# Patient Record
Sex: Female | Born: 1955 | Race: Black or African American | Hispanic: No | Smoking: Current every day smoker
Health system: Southern US, Community
[De-identification: ages and names within clinical notes are randomized; demographics above are authoritative.]

## PROBLEM LIST (undated history)

## (undated) DIAGNOSIS — I10 Essential (primary) hypertension: Secondary | ICD-10-CM

## (undated) DIAGNOSIS — M1990 Unspecified osteoarthritis, unspecified site: Secondary | ICD-10-CM

## (undated) HISTORY — PX: NASAL SINUS SURGERY: SHX719

---

## 2009-09-29 ENCOUNTER — Emergency Department (HOSPITAL_BASED_OUTPATIENT_CLINIC_OR_DEPARTMENT_OTHER): Admission: EM | Admit: 2009-09-29 | Discharge: 2009-09-29 | Payer: Self-pay | Admitting: Emergency Medicine

## 2009-11-06 ENCOUNTER — Ambulatory Visit: Payer: Self-pay | Admitting: Family

## 2009-11-06 DIAGNOSIS — G43909 Migraine, unspecified, not intractable, without status migrainosus: Secondary | ICD-10-CM | POA: Insufficient documentation

## 2009-11-06 DIAGNOSIS — R1013 Epigastric pain: Secondary | ICD-10-CM

## 2009-11-06 DIAGNOSIS — J329 Chronic sinusitis, unspecified: Secondary | ICD-10-CM | POA: Insufficient documentation

## 2009-11-27 ENCOUNTER — Other Ambulatory Visit: Admission: RE | Admit: 2009-11-27 | Discharge: 2009-11-27 | Payer: Self-pay | Admitting: Internal Medicine

## 2009-11-27 ENCOUNTER — Ambulatory Visit: Payer: Self-pay | Admitting: Family

## 2009-11-27 DIAGNOSIS — M545 Low back pain: Secondary | ICD-10-CM

## 2009-11-27 LAB — CONVERTED CEMR LAB: Pap Smear: NEGATIVE

## 2009-11-29 ENCOUNTER — Encounter (INDEPENDENT_AMBULATORY_CARE_PROVIDER_SITE_OTHER): Payer: Self-pay | Admitting: *Deleted

## 2009-12-03 ENCOUNTER — Encounter: Payer: Self-pay | Admitting: Family

## 2010-01-07 ENCOUNTER — Emergency Department (HOSPITAL_BASED_OUTPATIENT_CLINIC_OR_DEPARTMENT_OTHER): Admission: EM | Admit: 2010-01-07 | Discharge: 2010-01-07 | Payer: Self-pay | Admitting: Emergency Medicine

## 2010-08-24 ENCOUNTER — Encounter: Payer: Self-pay | Admitting: Internal Medicine

## 2010-09-02 NOTE — Letter (Signed)
   [facility] [address] [place], [place]  [address]  [place] Phone: [phone]      Dec 03, 2009   [facility] [place] [address] [place], [place] [address]  RE:  LAB RESULTS  Dear  Ms. Pinkney,  The following is an interpretation of your most recent lab tests.  Please take note of any instructions provided or changes to medications that have resulted from your lab work.  Pap Smear: normal   Sincerely Yours,    Lemont Fillers FNP

## 2010-09-02 NOTE — Letter (Signed)
Summary: New Patient letter  [facility]  [address] [place], [place] [address]   Phone: [phone]  Fax: [phone]       11/29/2009 MRN: [id number]  [facility] [address] [place], [place]  [address]  Dear Ms. Mesa,  Welcome to the Gastroenterology Division at [facility].    You are scheduled to see Dr. Juanda Chance on 5/18/2011at 9:15am on the 3rd floor at [facility], [address]  We ask that you try to arrive at our office 15 minutes prior to your appointment time to allow for check-in.  We would like you to complete the enclosed self-administered evaluation form prior to your visit and bring it with you on the day of your appointment.  We will review it with you.  Also, please bring a complete list of all your medications or, if you prefer, bring the medication bottles and we will list them.  Please bring your insurance card so that we may make a copy of it.  If your insurance requires a referral to see a specialist, please bring your referral form from your primary care physician.  Co-payments are due at the time of your visit and may be paid by cash, check or credit card.     Your office visit will consist of a consult with your physician (includes a physical exam), any laboratory testing he/she may order, scheduling of any necessary diagnostic testing (e.g. x-ray, ultrasound, CT-scan), and scheduling of a procedure (e.g. Endoscopy, Colonoscopy) if required.  Please allow enough time on your schedule to allow for any/all of these possibilities.    If you cannot keep your appointment, please call [phone] to cancel or reschedule prior to your appointment date.  This allows [place] the opportunity to schedule an appointment for another patient in need of care.  If you do not cancel or reschedule by 5 p.m. the business day prior to your appointment date, you will be charged a $50.00 late cancellation/no-show fee.    Thank you for choosing  [facility] for your medical needs.  We appreciate the opportunity to care for you.  Please visit [place] at our website  to learn more about our practice.                     Sincerely,                                                             The Gastroenterology Division
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
## 2014-03-08 ENCOUNTER — Encounter (HOSPITAL_BASED_OUTPATIENT_CLINIC_OR_DEPARTMENT_OTHER): Payer: Self-pay | Admitting: Emergency Medicine

## 2014-03-08 ENCOUNTER — Emergency Department (HOSPITAL_COMMUNITY): Payer: Medicare Other

## 2014-03-08 ENCOUNTER — Emergency Department (HOSPITAL_BASED_OUTPATIENT_CLINIC_OR_DEPARTMENT_OTHER): Payer: Medicare Other

## 2014-03-08 ENCOUNTER — Emergency Department (HOSPITAL_BASED_OUTPATIENT_CLINIC_OR_DEPARTMENT_OTHER)
Admission: EM | Admit: 2014-03-08 | Discharge: 2014-03-08 | Disposition: A | Discharge status: Home / Self Care | Payer: Medicare Other | Attending: Emergency Medicine | Admitting: Emergency Medicine

## 2014-03-08 DIAGNOSIS — Z8739 Personal history of other diseases of the musculoskeletal system and connective tissue: Secondary | ICD-10-CM | POA: Insufficient documentation

## 2014-03-08 DIAGNOSIS — R1084 Generalized abdominal pain: Secondary | ICD-10-CM

## 2014-03-08 DIAGNOSIS — I10 Essential (primary) hypertension: Secondary | ICD-10-CM | POA: Diagnosis not present

## 2014-03-08 DIAGNOSIS — Z79899 Other long term (current) drug therapy: Secondary | ICD-10-CM | POA: Insufficient documentation

## 2014-03-08 DIAGNOSIS — K529 Noninfective gastroenteritis and colitis, unspecified: Secondary | ICD-10-CM | POA: Insufficient documentation

## 2014-03-08 DIAGNOSIS — R110 Nausea: Secondary | ICD-10-CM | POA: Insufficient documentation

## 2014-03-08 HISTORY — DX: Unspecified osteoarthritis, unspecified site: M19.90

## 2014-03-08 HISTORY — DX: Essential (primary) hypertension: I10

## 2014-03-08 LAB — URINALYSIS, ROUTINE W REFLEX MICROSCOPIC
Bilirubin Urine: NEGATIVE
GLUCOSE, UA: NEGATIVE mg/dL
Ketones, ur: NEGATIVE mg/dL
Nitrite: NEGATIVE
Protein, ur: NEGATIVE mg/dL
Specific Gravity, Urine: 1.015 (ref 1.005–1.030)
UROBILINOGEN UA: 0.2 mg/dL (ref 0.0–1.0)
pH: 7.5 (ref 5.0–8.0)

## 2014-03-08 LAB — COMPREHENSIVE METABOLIC PANEL
ALT: 19 U/L (ref 0–35)
AST: 28 U/L (ref 0–37)
Albumin: 4 g/dL (ref 3.5–5.2)
Alkaline Phosphatase: 66 U/L (ref 39–117)
Anion gap: 15 (ref 5–15)
BUN: 5 mg/dL — ABNORMAL LOW (ref 6–23)
CHLORIDE: 100 meq/L (ref 96–112)
CO2: 29 mEq/L (ref 19–32)
CREATININE: 0.8 mg/dL (ref 0.50–1.10)
Calcium: 11 mg/dL — ABNORMAL HIGH (ref 8.4–10.5)
GFR calc Af Amer: >90 mL/min (ref >90)
GFR calc non Af Amer: 81 mL/min — ABNORMAL LOW (ref >90)
Glucose, Bld: 108 mg/dL — ABNORMAL HIGH (ref 70–99)
Potassium: 3 mEq/L — ABNORMAL LOW (ref 3.7–5.3)
Sodium: 144 mEq/L (ref 137–147)
Total Protein: 8.3 g/dL (ref 6.0–8.3)

## 2014-03-08 LAB — CBC
HCT: 39.7 % (ref 36.0–46.0)
Hemoglobin: 13.9 g/dL (ref 12.0–15.0)
MCH: 30.3 pg (ref 26.0–34.0)
MCHC: 35 g/dL (ref 30.0–36.0)
MCV: 86.7 fL (ref 78.0–100.0)
PLATELETS: 444 10*3/uL — AB (ref 150–400)
RBC: 4.58 MIL/uL (ref 3.87–5.11)
RDW: 13.3 % (ref 11.5–15.5)
WBC: 10.2 10*3/uL (ref 4.0–10.5)

## 2014-03-08 LAB — LIPASE, BLOOD: LIPASE: 12 U/L (ref 11–59)

## 2014-03-08 LAB — URINE MICROSCOPIC-ADD ON

## 2014-03-08 MED ORDER — ONDANSETRON 4 MG PO TBDP: 4.0000 mg | ORAL_TABLET | Freq: Three times a day (TID) | ORAL | Status: AC | PRN

## 2014-03-08 MED ORDER — SODIUM CHLORIDE 0.9 % IV BOLUS (SEPSIS)
1000.0000 mL | Freq: Once | INTRAVENOUS | Status: AC
  Administered 2014-03-08: 1000 mL via INTRAVENOUS

## 2014-03-08 MED ORDER — MORPHINE SULFATE 2 MG/ML IJ SOLN
2.0000 mg | Freq: Once | INTRAMUSCULAR | Status: AC
  Administered 2014-03-08: 2 mg via INTRAVENOUS
  Filled 2014-03-08: qty 1

## 2014-03-08 MED ORDER — PANTOPRAZOLE SODIUM 40 MG PO TBEC: 40.0000 mg | DELAYED_RELEASE_TABLET | Freq: Every day | ORAL | Status: AC

## 2014-03-08 MED ORDER — MORPHINE SULFATE 2 MG/ML IJ SOLN
2.0000 mg | Freq: Once | INTRAMUSCULAR | Status: AC
  Administered 2014-03-08: 2 mg via INTRAVENOUS

## 2014-03-08 MED ORDER — HYDROCODONE-ACETAMINOPHEN 5-325 MG PO TABS: 1.0000 | ORAL_TABLET | ORAL | Status: DC | PRN

## 2014-03-08 MED ORDER — MORPHINE SULFATE 4 MG/ML IJ SOLN
4.0000 mg | Freq: Once | INTRAMUSCULAR | Status: AC
  Administered 2014-03-08: 4 mg via INTRAVENOUS
  Filled 2014-03-08: qty 1

## 2014-03-08 MED ORDER — ONDANSETRON HCL 4 MG/2ML IJ SOLN
4.0000 mg | Freq: Once | INTRAMUSCULAR | Status: AC
  Administered 2014-03-08: 4 mg via INTRAVENOUS
  Filled 2014-03-08: qty 2

## 2014-03-08 MED ORDER — IOHEXOL 300 MG/ML  SOLN
100.0000 mL | Freq: Once | INTRAMUSCULAR | Status: AC | PRN
  Administered 2014-03-08: 100 mL via INTRAVENOUS

## 2014-03-08 MED ORDER — MORPHINE SULFATE 2 MG/ML IJ SOLN
1.0000 mg | Freq: Once | INTRAMUSCULAR | Status: DC
  Filled 2014-03-08: qty 1

## 2014-03-08 MED ORDER — CIPROFLOXACIN HCL 500 MG PO TABS: 500.0000 mg | ORAL_TABLET | Freq: Two times a day (BID) | ORAL | Status: DC

## 2014-03-08 MED ORDER — METRONIDAZOLE 500 MG PO TABS: 500.0000 mg | ORAL_TABLET | Freq: Three times a day (TID) | ORAL | Status: AC

## 2014-03-08 NOTE — ED Provider Notes (Signed)
CSN: [id number]     Arrival date & time 03/08/14  1052 History   First MD Initiated Contact with Patient 03/08/14 1109   History provided by patient.   Chief Complaint  Patient presents with  . Abdominal Pain   HPI  Patient reports that symptoms started on Sunday night with some Right ear "fullness and popping" and stomach / epigastric burning. Prior to onset of these symptoms, she complained of sinus symptoms with nasal congestion and headaches. Denied any fevers/chills, cough, SOB, CP. She reports that her presenting symptoms have gradually worsened with persistent Right ear fullness and decreased hearing on that side, persistent "stomach burning" with some progression to lower abdominal cramping and LLQ / left flank pain, pain seems to be worse with PO intake, associated with nausea, reduced appetite and decreased PO intake. Significant worsening symptoms starting last night with several episodes of vomiting (non-bilious, non-bloody) and diarrhea (non-bloody), unable to keep down water. She tried taking multiple OTC medications such as Pepto-bismal, Imodium, TUMs without any relief.  Additionally, with frontal headache, +photophobia.  Significant PMH HTN. Surgical Hx with C-section x 2, no other abdominal surgeries. Admits active smoker, denies alcohol or drug use.  Past Medical History  Diagnosis Date  . Hypertension   . Arthritis    Past Surgical History  Procedure Laterality Date  . Nasal sinus surgery    . Cesarean section      x 2   No family history on file. History  Substance Use Topics  . Smoking status: Not on file  . Smokeless tobacco: Not on file  . Alcohol Use: Not on file   OB History   Grav Para Term Preterm Abortions TAB SAB Ect Mult Living                 Review of Systems  See above HPI  Allergies  Review of patient's allergies indicates no known allergies.  Home Medications   Prior to Admission medications   Medication Sig Start Date End Date  Taking? Authorizing Provider  amLODipine (NORVASC) 5 MG tablet Take 5 mg by mouth daily.   Yes Historical Provider, MD  hydrochlorothiazide (MICROZIDE) 12.5 MG capsule Take 12.5 mg by mouth daily.   Yes Historical Provider, MD  Loperamide HCl (IMODIUM PO) Take by mouth.   Yes Historical Provider, MD   BP 153/87  Pulse 74  Temp(Src) 98.5 F (36.9 C) (Oral)  Resp 18  Ht 5\' 2"  (1.575 m)  Wt 149 lb (67.586 kg)  BMI 27.25 kg/m2  SpO2 100% Physical Exam  Gen - ill-appearing but non-toxic, cooperative, worse pain with lights on, some discomfort d/t abd pain HEENT - NCAT, PERRL, EOMI, no nasal congestion, TM's b/l non-erythematous R>L mild serous effusion w/o bulging, oropharynx clear, dry MM and tongue Neck - supple, non-tender, no LAD Heart - RRR, no murmurs heard Lungs - CTAB. Normal work of breathing. Abd - soft, +epigastric and LUQ / LLQ abdominal tenderness,non-distended, palpation of RLQ without local pain but referred pain to LLQ, no discernable rebound tenderness, no masses, +active BS Ext - non-tender, no edema, peripheral pulses intact +2 b/l Skin - warm, dry, no rashes Neuro - awake, alert, oriented, grossly non-focal, intact muscle strength 5/5 b/l  ED Course  Procedures (including critical care time) Labs Review Labs Reviewed  URINALYSIS, ROUTINE W REFLEX MICROSCOPIC - Abnormal; Notable for the following:    Hgb urine dipstick MODERATE (*)    Leukocytes, UA TRACE (*)    All other components  within normal limits  COMPREHENSIVE METABOLIC PANEL - Abnormal; Notable for the following:    Potassium 3.0 (*)    Glucose, Bld 108 (*)    BUN 5 (*)    Calcium 11.0 (*)    Total Bilirubin <0.2 (*)    GFR calc non Af Amer 81 (*)    All other components within normal limits  CBC - Abnormal; Notable for the following:    Platelets 444 (*)    All other components within normal limits  URINE MICROSCOPIC-ADD ON  LIPASE, BLOOD    Imaging Review Ct Abdomen Pelvis W  Contrast  03/08/2014   CLINICAL DATA:  Abdominal pain, diarrhea, vomiting, nausea  EXAM: CT ABDOMEN AND PELVIS WITH CONTRAST  TECHNIQUE: Multidetector CT imaging of the abdomen and pelvis was performed using the standard protocol following bolus administration of intravenous contrast.  CONTRAST:  OMNIPAQUE IOHEXOL 300 MG/ML  SOLN  COMPARISON:  None.  FINDINGS: Lung bases are unremarkable. Sagittal images of the spine shows disc space flattening with mild disc bulge at L4-L5 level. Minimal anterolisthesis L4 on L5 vertebral body.  Mild hepatic fatty infiltration. No calcified gallstones are noted within gallbladder. No focal hepatic mass. The pancreas, spleen and adrenal glands are unremarkable. There is cortical scarring in upper pole of the left kidney. Kidneys are symmetrical in enhancement. No hydronephrosis or hydroureter. Mild atherosclerotic calcifications of abdominal aorta and iliac arteries. No aortic aneurysm.  Delayed renal images shows bilateral renal symmetrical excretion. Bilateral visualized proximal ureter is unremarkable.  There is no small bowel obstruction. No ascites or free air. No adenopathy. The terminal ileum is unremarkable. No pericecal inflammation. Normal appendix clearly visualized in coronal image 33.  There is nonspecific mild thickening of descending colon wall. There is mild thickening of sigmoid colon wall as seen in axial image 61. Nonspecific mild colitis is suspected. No colonic obstruction. The uterus and adnexa are unremarkable. The urinary bladder is mild distended. No destructive bony lesions are noted within pelvis. Small nonspecific bilateral inguinal lymph nodes.  IMPRESSION: 1. There is nonspecific mild thickening of descending colon and sigmoid colon wall. Nonspecific mild colitis is suspected. Clinical correlation is necessary. 2. Normal appendix. No pericecal inflammation. No small bowel obstruction. 3. No hydronephrosis or hydroureter. 4. Mild hepatic fatty  infiltration.   Electronically Signed   By: Natasha Mead M.D.   On: 03/08/2014 14:58     EKG Interpretation None      MDM   Final diagnoses:  Generalized abdominal pain  Colitis presumed infectious  Nausea   58 yr F with PMH HTN, GERD (no longer on PPI), presents with gradually worsening epigastric / LLQ abdominal pain (burning, cramping) for past 5 days, with acute worsening nausea / vomiting / diarrhea x 24 hours, decreased PO intake. Associated with R-ear fullness / decreased hearing, HAs +photophobia. Afebrile with stable vitals, significant discomfort on exam with some shaking and notable epigastric / LLQ abdominal pain. Concern for acute abdominal process (cholecysitis vs pancreatitis, cannot r/o appendicitis), pending further work-up, suggestive of viral gastroenteritis with n/v x 24 hrs, also likely component of GERD with epigastric pain and concern for potential PUD (hx of arthritis, HAs with prior NSAID use, denies any recently), suspect concurrent sinusitis with allergy component vs migraine, seems unrelated to abdominal pain.  Proceed with work-up CMET, CBC, Lipase, UA, CT Abd/Pelvis w/ IV and PO contrast.   UPDATE @ 1210 - CMET with hypoK to 3.0, nml Cr, nml LFTs and neg T.Bili, Lipase  12, CBC with WBC 10.4, stable Hgb, UA no evidence of infection but mod Hgb and 7-10 RBC, consider potential nephrolithiasis (CT will r/o). Ordered Zofran IV and Morphine 2mg  x 1 dose with pain / nausea, no vomiting. Remains afebrile. Pending CT Abd.  UPDATE @ 1452 - CT Abd/Pelvis with non-specific mild thickening of descending colon and sigmoid colon wall, suggestive of mild non-specific colitis. Normal appendix, SBO, or hydronephrosis. Patient reports improved nausea, persistent abdominal pain, ordered repeat Morphine 2mg  IV dose (no significant relief from initial dose)  UPDATE @ 1358 - Patient with improved pain, s/p addition Morphine 4mg  IV dose, overall received total Morphine 8mg  IV. Presumed  non-specific colitis infection, possible component of viral gastro with n/v. Also, suspect uncontrolled GERD.  Discharge to home with Flagyl, Cipro PO course, Zofran ODT and Norco PRN, PPI with Protonix daily, may use Tylenol PRN. Advised on return criteria. Recommend close f/u in 2-3 days, if not improving.   Saralyn Pilar, DO 03/08/14 1614  Saralyn Pilar, DO 03/08/14 1620

## 2014-03-08 NOTE — ED Notes (Signed)
Patient states she has generalized abdominal pain associated with nausea, vomiting and diarrhea.  Started four days ago, every time she eats something she has vomiting, and diarrhea started last night and lasted all night.  States she also has a headache and right pain and decreased hearing.

## 2014-03-08 NOTE — Discharge Instructions (Signed)
You were evaluated for abdominal pain and nausea. You received some Morphine IV and Zofran IV, as well as IV fluids. CT scan of your abdomen showed some non-specific colon inflammation, which is likely caused by an infection. Prescribed 2 different antibiotics - Cipro and Flagyl, please take these as prescribed and complete the full course. Also prescribed anti-nausea medicine (Zofran - oral dissolving tablets) and Norco (Hydrocodone) for pain - take these as needed. You may also take Tylenol for fever or chills. Prescribed Pantoprazole (Protonix) 40mg  daily for reflux disease. Important to stay well hydrated, if you can tolerate liquids, and slowly advance your diet as tolerated. May start with bland foods or clear liquids if needed.  If your abdominal pain continues to worsen, persistent diarrhea / nausea / vomiting, or any blood in your vomit or diarrhea, persistent fevers/chills that do not improve with Tylenol. Please call your regular doctor, to seek immediate medical care, otherwise please return to the Emergency Department.  Colitis Colitis is inflammation of the colon. Colitis can be a short-term or long-standing (chronic) illness. Crohn's disease and ulcerative colitis are 2 types of colitis which are chronic. They usually require lifelong treatment. CAUSES  There are many different causes of colitis, including:  Viruses.  Germs (bacteria).  Medicine reactions. SYMPTOMS   Diarrhea.  Intestinal bleeding.  Pain.  Fever.  Throwing up (vomiting).  Tiredness (fatigue).  Weight loss.  Bowel blockage. DIAGNOSIS  The diagnosis of colitis is based on examination and stool or blood tests. X-rays, CT scan, and colonoscopy may also be needed. TREATMENT  Treatment may include:  Fluids given through the vein (intravenously).  Bowel rest (nothing to eat or drink for a period of time).  Medicine for pain and diarrhea.  Medicines (antibiotics) that kill germs.  Cortisone  medicines.  Surgery. HOME CARE INSTRUCTIONS   Get plenty of rest.  Drink enough water and fluids to keep your urine clear or pale yellow.  Eat a well-balanced diet.  Call your caregiver for follow-up as recommended. SEEK IMMEDIATE MEDICAL CARE IF:   You develop chills.  You have an oral temperature above 102 F (38.9 C), not controlled by medicine.  You have extreme weakness, fainting, or dehydration.  You have repeated vomiting.  You develop severe belly (abdominal) pain or are passing bloody or tarry stools. MAKE SURE YOU:   Understand these instructions.  Will watch your condition.  Will get help right away if you are not doing well or get worse. Document Released: 08/27/2004 Document Revised: 10/12/2011 Document Reviewed: 11/22/2009 [facility] Patient Information 2015 [employer] [place]. This information is not intended to replace advice given to you by your health care provider. Make sure you discuss any questions you have with your health care provider.

## 2014-03-08 NOTE — ED Notes (Signed)
MD at bedside. 

## 2014-03-09 NOTE — ED Provider Notes (Signed)
I saw and evaluated the patient, reviewed the resident's note and I agree with the findings and plan.   EKG Interpretation None      Pt with abd pain, ct shows colitis, pt feeling better, can be treated as outpatient  Rolan BuccoMelanie Deboraha Goar, MD 03/09/14 0710

## 2014-12-19 IMAGING — CT CT ABD-PELV W/ CM
1 of 3 series · 14 of 32 positions shown, 19 images · IV contrast (APPLIED)
Comparison: None.

CLINICAL DATA: Abdominal pain, diarrhea, vomiting, nausea

EXAM:
CT ABDOMEN AND PELVIS WITH CONTRAST
TECHNIQUE: Multidetector CT imaging of the abdomen and pelvis was performed
using the standard protocol following bolus administration of
intravenous contrast.
CONTRAST:  100mL OMNIPAQUE IOHEXOL 300 MG/ML  SOLN

[Series 2: abd/pelvis 5.0 b31f · axial · 0.70mm/px · z∈[-466,-76]mm · 14 of 88 slices shown, 19 images]
[im 5/88  soft-tissue]
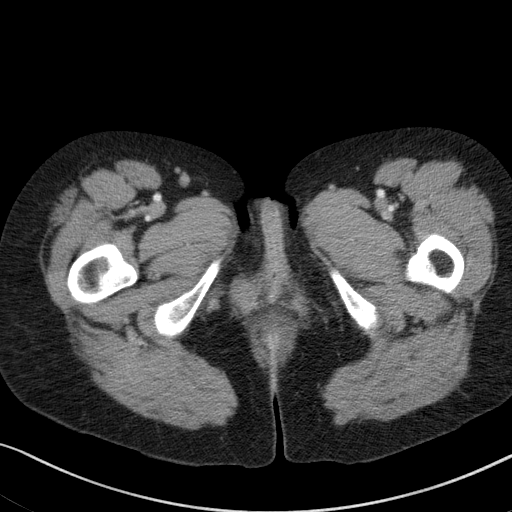
[im 5/88  bone]
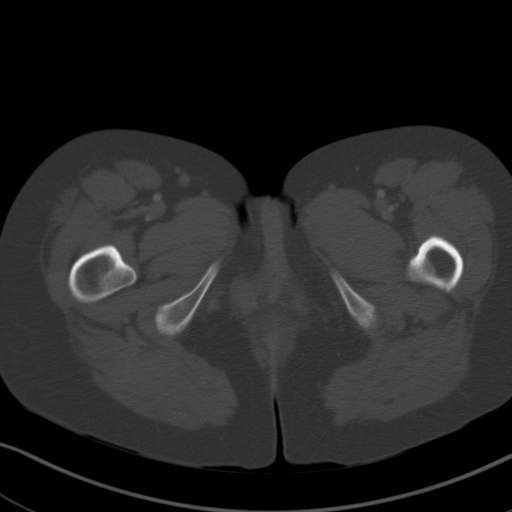
[im 13/88  soft-tissue]
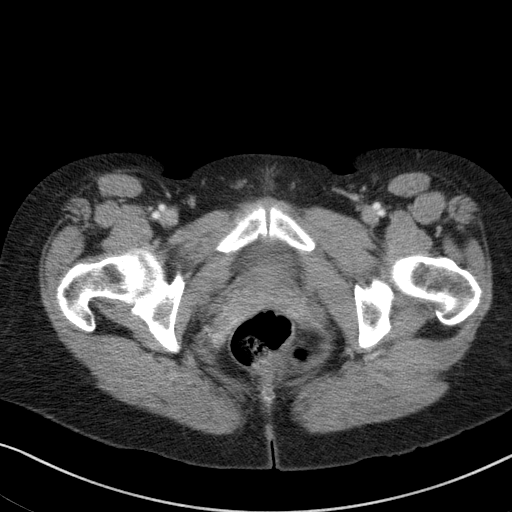
[im 17/88  soft-tissue]
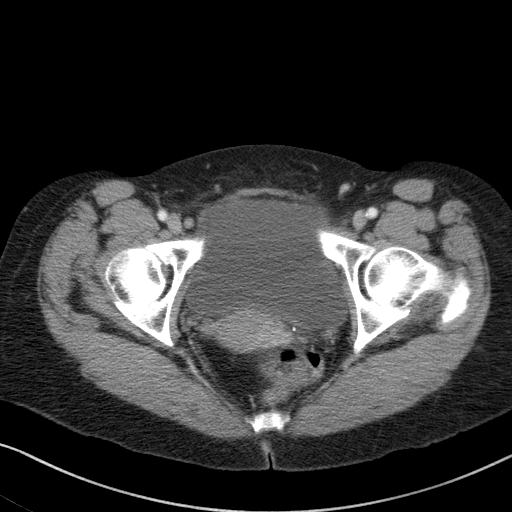
[im 25/88  soft-tissue]
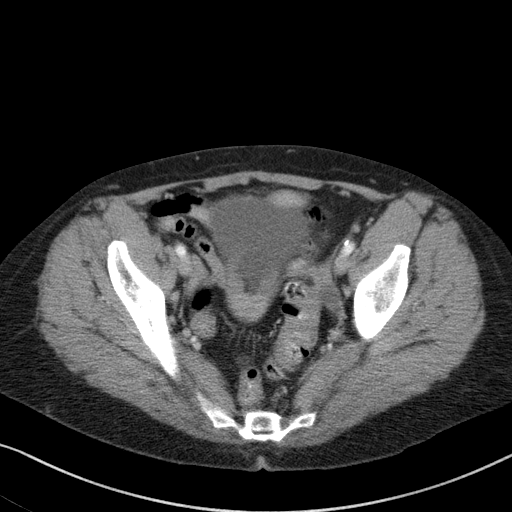
[im 30/88  soft-tissue]
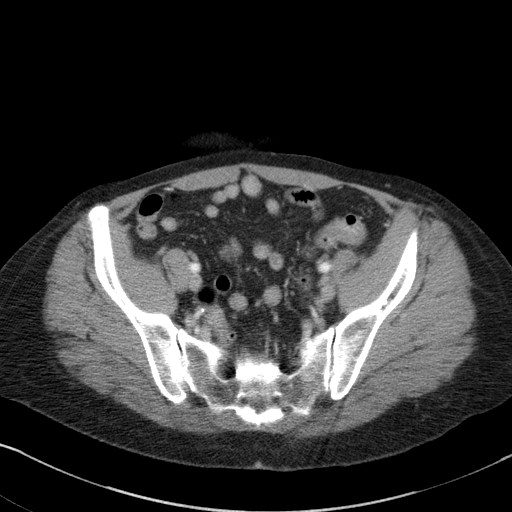
[im 38/88  soft-tissue]
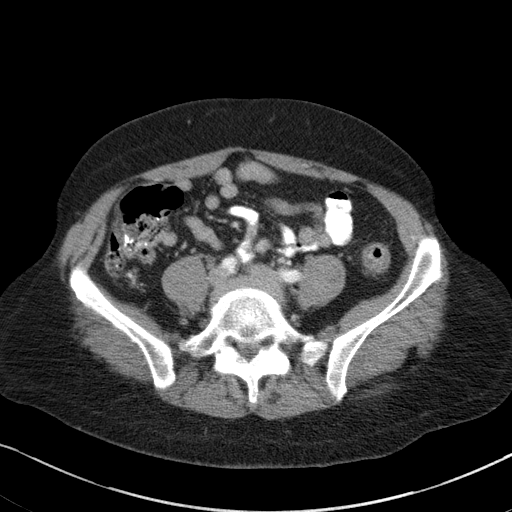
[im 46/88  soft-tissue]
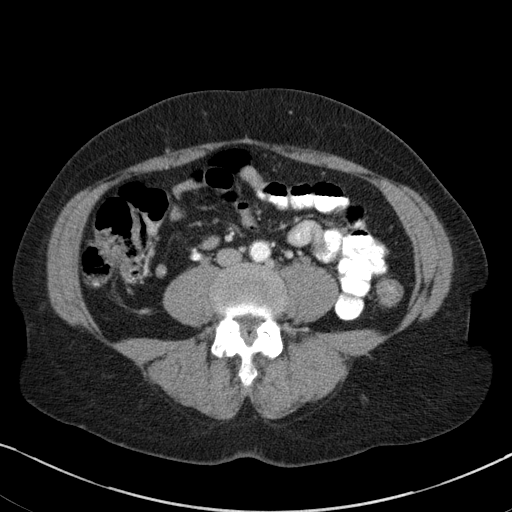
[im 50/88  soft-tissue]
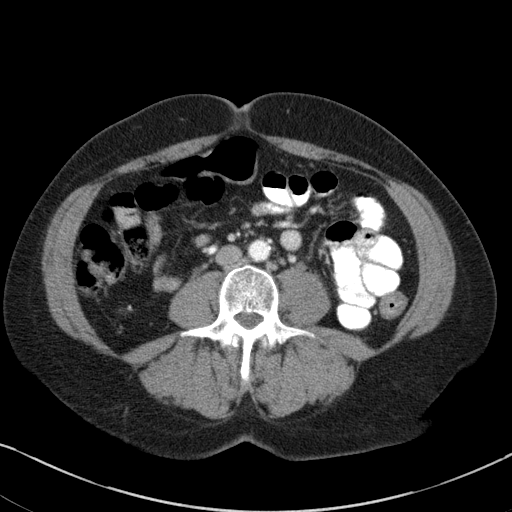
[im 59/88  soft-tissue]
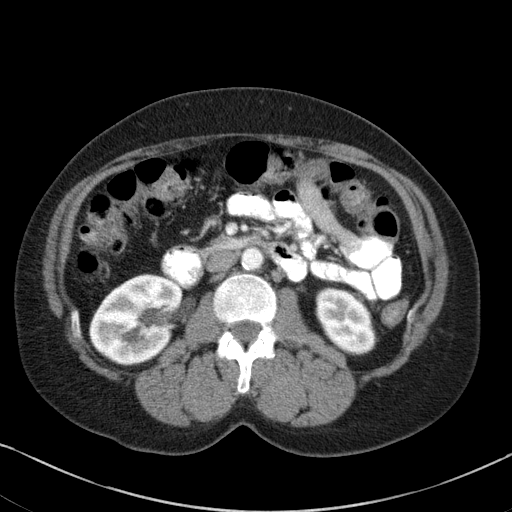
[im 59/88  bone]
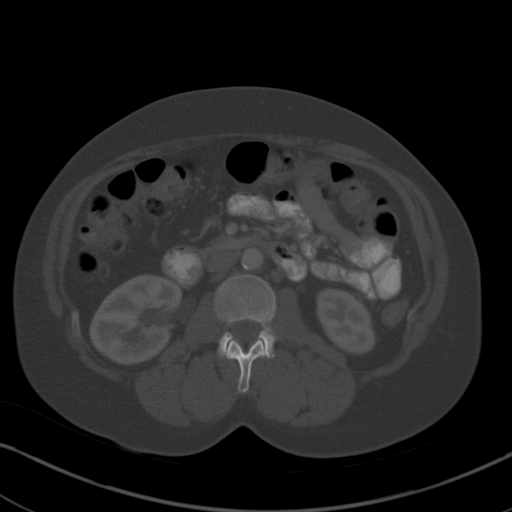
[im 63/88  soft-tissue]
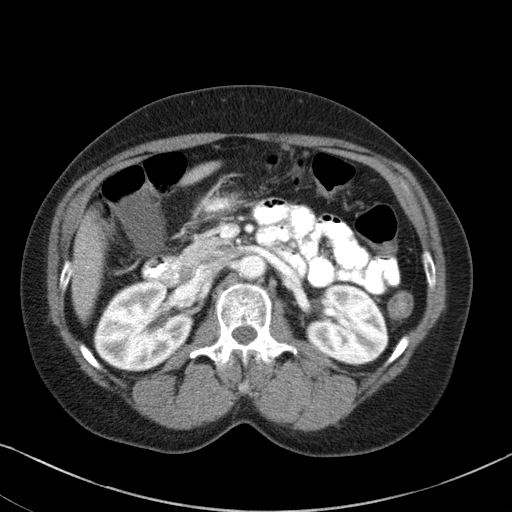
[im 71/88  soft-tissue]
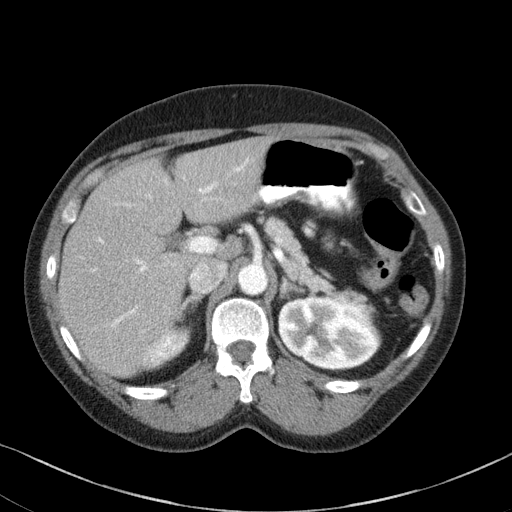
[im 71/88  lung]
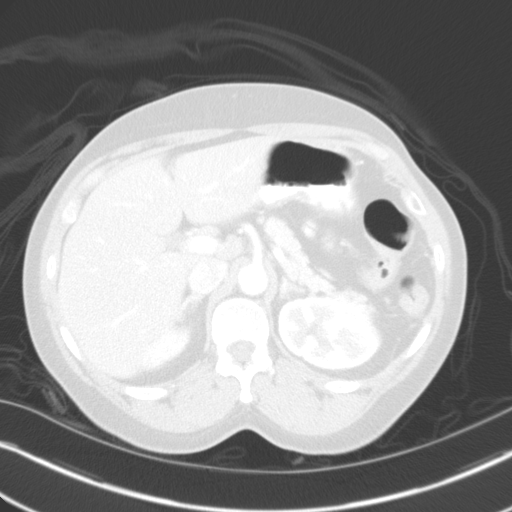
[im 75/88  soft-tissue]
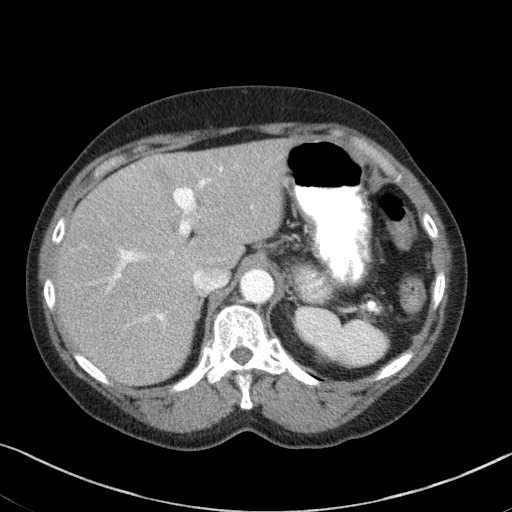
[im 75/88  lung]
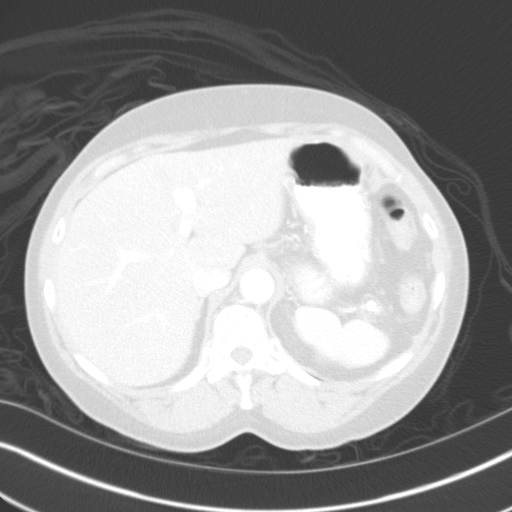
[im 79/88  lung]
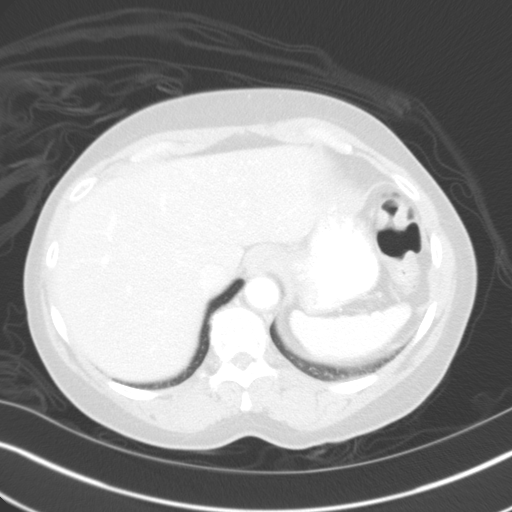
[im 83/88  soft-tissue]
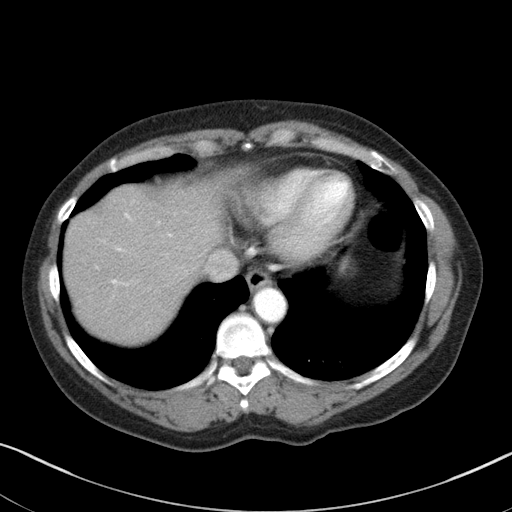
[im 83/88  lung]
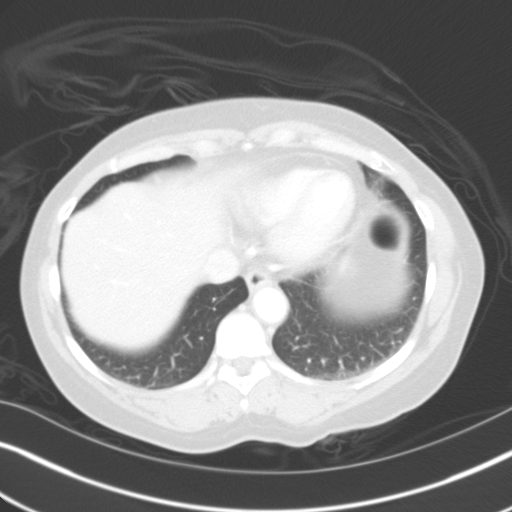

[14 of 32 positions shown; findings below may reference images not displayed]

FINDINGS: Lung bases are unremarkable. Sagittal images of the spine shows disc
space flattening with mild disc bulge at L4-L5 level. Minimal
anterolisthesis L4 on L5 vertebral body.

Mild hepatic fatty infiltration. No calcified gallstones are noted
within gallbladder. No focal hepatic mass. The pancreas, spleen and
adrenal glands are unremarkable. There is cortical scarring in upper
pole of the left kidney. Kidneys are symmetrical in enhancement. No
hydronephrosis or hydroureter. Mild atherosclerotic calcifications
of abdominal aorta and iliac arteries. No aortic aneurysm.

Delayed renal images shows bilateral renal symmetrical excretion.
Bilateral visualized proximal ureter is unremarkable.

There is no small bowel obstruction. No ascites or free air. No
adenopathy. The terminal ileum is unremarkable. No pericecal
inflammation. Normal appendix clearly visualized in coronal image
33.

There is nonspecific mild thickening of descending colon wall. There
is mild thickening of sigmoid colon wall as seen in axial image 61.
Nonspecific mild colitis is suspected. No colonic obstruction. The
uterus and adnexa are unremarkable. The urinary bladder is mild
distended. No destructive bony lesions are noted within pelvis.
Small nonspecific bilateral inguinal lymph nodes.
IMPRESSION: 1. There is nonspecific mild thickening of descending colon and
sigmoid colon wall. Nonspecific mild colitis is suspected. Clinical
correlation is necessary.
2. Normal appendix. No pericecal inflammation. No small bowel
obstruction.
3. No hydronephrosis or hydroureter.
4. Mild hepatic fatty infiltration.

## 2016-03-23 ENCOUNTER — Encounter (HOSPITAL_BASED_OUTPATIENT_CLINIC_OR_DEPARTMENT_OTHER): Payer: Self-pay | Admitting: *Deleted

## 2016-03-23 ENCOUNTER — Emergency Department (HOSPITAL_BASED_OUTPATIENT_CLINIC_OR_DEPARTMENT_OTHER)
Admission: EM | Admit: 2016-03-23 | Discharge: 2016-03-23 | Disposition: A | Discharge status: Home / Self Care | Payer: Medicare Other | Attending: Emergency Medicine | Admitting: Emergency Medicine

## 2016-03-23 DIAGNOSIS — F17200 Nicotine dependence, unspecified, uncomplicated: Secondary | ICD-10-CM | POA: Diagnosis not present

## 2016-03-23 DIAGNOSIS — M545 Low back pain: Secondary | ICD-10-CM | POA: Diagnosis not present

## 2016-03-23 DIAGNOSIS — M549 Dorsalgia, unspecified: Secondary | ICD-10-CM | POA: Diagnosis present

## 2016-03-23 DIAGNOSIS — Z79899 Other long term (current) drug therapy: Secondary | ICD-10-CM | POA: Insufficient documentation

## 2016-03-23 DIAGNOSIS — I10 Essential (primary) hypertension: Secondary | ICD-10-CM | POA: Insufficient documentation

## 2016-03-23 LAB — COMPREHENSIVE METABOLIC PANEL
ALT: 22 U/L (ref 14–54)
AST: 24 U/L (ref 15–41)
Albumin: 3.8 g/dL (ref 3.5–5.0)
Alkaline Phosphatase: 59 U/L (ref 38–126)
Anion gap: 8 (ref 5–15)
BUN: 11 mg/dL (ref 6–20)
CHLORIDE: 104 mmol/L (ref 101–111)
CO2: 22 mmol/L (ref 22–32)
Calcium: 9.4 mg/dL (ref 8.9–10.3)
Creatinine, Ser: 0.74 mg/dL (ref 0.44–1.00)
GFR calc Af Amer: >60 mL/min (ref >60)
GFR calc non Af Amer: >60 mL/min (ref >60)
GLUCOSE: 106 mg/dL — AB (ref 65–99)
Potassium: 3.3 mmol/L — ABNORMAL LOW (ref 3.5–5.1)
Sodium: 134 mmol/L — ABNORMAL LOW (ref 135–145)
Total Bilirubin: 0.2 mg/dL — ABNORMAL LOW (ref 0.3–1.2)
Total Protein: 7.3 g/dL (ref 6.5–8.1)

## 2016-03-23 LAB — URINALYSIS, ROUTINE W REFLEX MICROSCOPIC
Bilirubin Urine: NEGATIVE
Glucose, UA: NEGATIVE mg/dL
Ketones, ur: NEGATIVE mg/dL
Leukocytes, UA: NEGATIVE
Nitrite: NEGATIVE
Protein, ur: NEGATIVE mg/dL
SPECIFIC GRAVITY, URINE: 1.01 (ref 1.005–1.030)
pH: 6 (ref 5.0–8.0)

## 2016-03-23 LAB — CBC WITH DIFFERENTIAL/PLATELET
BASOS ABS: 0 10*3/uL (ref 0.0–0.1)
Basophils Relative: 0 %
EOS ABS: 0.2 10*3/uL (ref 0.0–0.7)
EOS PCT: 2 %
HCT: 35.4 % — ABNORMAL LOW (ref 36.0–46.0)
Hemoglobin: 12.2 g/dL (ref 12.0–15.0)
LYMPHS PCT: 54 %
Lymphs Abs: 5.3 10*3/uL — ABNORMAL HIGH (ref 0.7–4.0)
MCH: 31 pg (ref 26.0–34.0)
MCHC: 34.5 g/dL (ref 30.0–36.0)
MCV: 90.1 fL (ref 78.0–100.0)
Monocytes Absolute: 0.7 10*3/uL (ref 0.1–1.0)
Monocytes Relative: 7 %
Neutro Abs: 3.5 10*3/uL (ref 1.7–7.7)
Neutrophils Relative %: 37 %
PLATELETS: 418 10*3/uL — AB (ref 150–400)
RBC: 3.93 MIL/uL (ref 3.87–5.11)
RDW: 13.6 % (ref 11.5–15.5)
WBC: 9.7 10*3/uL (ref 4.0–10.5)

## 2016-03-23 LAB — URINE MICROSCOPIC-ADD ON

## 2016-03-23 LAB — LIPASE, BLOOD: Lipase: 20 U/L (ref 11–51)

## 2016-03-23 MED ORDER — HYDROCODONE-ACETAMINOPHEN 5-325 MG PO TABS
2.0000 | ORAL_TABLET | Freq: Once | ORAL | Status: AC
  Administered 2016-03-23: 2 via ORAL
  Filled 2016-03-23: qty 2

## 2016-03-23 MED ORDER — HYDROCHLOROTHIAZIDE 12.5 MG PO CAPS: 12.5000 mg | ORAL_CAPSULE | Freq: Every day | ORAL | 0 refills | Status: AC

## 2016-03-23 MED ORDER — AMLODIPINE BESYLATE 5 MG PO TABS: 5.0000 mg | ORAL_TABLET | Freq: Every day | ORAL | 0 refills | Status: AC

## 2016-03-23 NOTE — ED Notes (Signed)
MD at bedside. 

## 2016-03-23 NOTE — ED Provider Notes (Signed)
[facility] Provider Note   CSN: [id number] Arrival date & time: 03/23/16  1747   By signing my name below, I, Nelwyn SalisburyJoshua Fowler, attest that this documentation has been prepared under the direction and in the presence of Azalia BilisKevin Lakeena Downie, MD . Electronically Signed: Nelwyn SalisburyJoshua Fowler, Scribe. 03/23/2016. 6:22 PM.  History   Chief Complaint Chief Complaint  Patient presents with  . Back Pain   The history is provided by the patient. No language interpreter was used.     HPI Comments:  Tiffany Mcdonald is a 60 y.o. female with PMHx of Arthritis who presents to the Emergency Department complaining of constant radiant back cramps onset one week. She takes regular pain medication (hydrocodone) for relief, but recently ran out. Pt reports associated upper abdominal pain, right-sided pain, and foot numbness worsened by walking. Her abdominal pain is worsened by palpation. She denies nausea, vomiting, diarrhea, dysuria or rash.  Past Medical History:  Diagnosis Date  . Arthritis   . Hypertension     Patient Active Problem List   Diagnosis Date Noted  . LOW BACK PAIN, CHRONIC 11/27/2009  . MIGRAINE HEADACHE 11/06/2009  . SINUSITIS 11/06/2009  . EPIGASTRIC DISCOMFORT 11/06/2009    Past Surgical History:  Procedure Laterality Date  . CESAREAN SECTION     x 2  . NASAL SINUS SURGERY      OB History    No data available       Home Medications    Prior to Admission medications   Medication Sig Start Date End Date Taking? Authorizing Provider  amLODipine (NORVASC) 5 MG tablet Take 5 mg by mouth daily.    Historical Provider, MD  hydrochlorothiazide (MICROZIDE) 12.5 MG capsule Take 12.5 mg by mouth daily.    Historical Provider, MD  Loperamide HCl (IMODIUM PO) Take by mouth.    Historical Provider, MD  metroNIDAZOLE (FLAGYL) 500 MG tablet Take 1 tablet (500 mg total) by mouth 3 (three) times daily. Do not drink alcohol while taking this medicine. 03/08/14   Smitty CordsAlexander J Karamalegos,  MD  ondansetron (ZOFRAN ODT) 4 MG disintegrating tablet Take 1 tablet (4 mg total) by mouth every 8 (eight) hours as needed for nausea or vomiting. 03/08/14   Smitty CordsAlexander J Karamalegos, MD  pantoprazole (PROTONIX) 40 MG tablet Take 1 tablet (40 mg total) by mouth daily. 03/08/14   Smitty CordsAlexander J Karamalegos, MD    Family History No family history on file.  Social History Social History  Substance Use Topics  . Smoking status: Current Every Day Smoker    Packs/day: 0.50  . Smokeless tobacco: Not on file  . Alcohol use No     Allergies   Review of patient's allergies indicates no known allergies.   Review of Systems Review of Systems  10 Systems reviewed and are negative for acute change except as noted in the HPI.  Physical Exam Updated Vital Signs BP 143/98 (BP Location: Right Arm)   Pulse 62   Temp 98.6 F (37 C) (Oral)   Resp 18   Ht 5\' 2"  (1.575 m)   Wt 145 lb (65.8 kg)   SpO2 100%   BMI 26.52 kg/m   Physical Exam  Constitutional: She is oriented to person, place, and time. She appears well-developed and well-nourished. No distress.  HENT:  Head: Normocephalic and atraumatic.  Eyes: EOM are normal.  Neck: Normal range of motion.  Cardiovascular: Normal rate, regular rhythm and normal heart sounds.   Pulmonary/Chest: Effort normal and breath sounds normal.  Abdominal: Soft. She exhibits no distension. There is no tenderness.  Musculoskeletal: Normal range of motion.  Mild paralumbar tenderness.  Neurological: She is alert and oriented to person, place, and time.  Skin: Skin is warm and dry.  Psychiatric: She has a normal mood and affect. Judgment normal.  Nursing note and vitals reviewed.    ED Treatments / Results  DIAGNOSTIC STUDIES:  Oxygen Saturation is 100% on RA, normal by my interpretation.    COORDINATION OF CARE:  6:21 PM Discussed treatment plan with pt at bedside which included US and blood work and pt agreed to plan.  Labs (all labs ordered are  listed, but only abnormal results are displayed) Labs Reviewed  CBC WITH DIFFERENTIAL/PLATELET - Abnormal; Notable for the following:       Result Value   HCT 35.4 (*)    Platelets 418 (*)    Lymphs Abs 5.3 (*)    All other components within normal limits  COMPREHENSIVE METABOLIC PANEL - Abnormal; Notable for the following:    Sodium 134 (*)    Potassium 3.3 (*)    Glucose, Bld 106 (*)    Total Bilirubin 0.2 (*)    All other components within normal limits  URINALYSIS, ROUTINE W REFLEX MICROSCOPIC ([facility]) - Abnormal; Notable for the following:    Hgb urine dipstick SMALL (*)    All other components within normal limits  URINE MICROSCOPIC-ADD ON - Abnormal; Notable for the following:    Squamous Epithelial / LPF 0-5 (*)    Bacteria, UA RARE (*)    All other components within normal limits  LIPASE, BLOOD    EKG  EKG Interpretation None       Radiology No results found.  Procedures Procedures (including critical care time)  Medications Ordered in ED Medications  HYDROcodone-acetaminophen (NORCO/VICODIN) 5-325 MG per tablet 2 tablet (2 tablets Oral Given 03/23/16 1900)     Initial Impression / Assessment and Plan / ED Course  I have reviewed the triage vital signs and the nursing notes.  Pertinent labs & imaging results that were available during my care of the patient were reviewed by me and considered in my medical decision making (see chart for details).  Clinical Course    Patient is overall well-appearing.  This appears to be musculoskeletal low back pain.  No indication for advanced imaging.  Primary care follow-up.  She'll need follow-up with her physicians in regard to her ongoing pain as she needs a refill of her hydrocodone.  She understands this cannot be refilled to the ER.  Final Clinical Impressions(s) / ED Diagnoses   Final diagnoses:  None    New Prescriptions New Prescriptions   No medications on file   I personally performed the  services described in this documentation, which was scribed in my presence. The recorded information has been reviewed and is accurate.        Azalia BilisKevin Janae Bonser, MD 03/23/16 2123

## 2016-03-23 NOTE — ED Triage Notes (Signed)
Pt c/o lower back pain x 1 week
